# Patient Record
Sex: Female | Born: 1970 | Hispanic: No | Marital: Married | Smoking: Never smoker
Health system: Southern US, Community
[De-identification: ages and names within clinical notes are randomized; demographics above are authoritative.]

## PROBLEM LIST (undated history)

## (undated) DIAGNOSIS — D649 Anemia, unspecified: Secondary | ICD-10-CM

## (undated) DIAGNOSIS — M1990 Unspecified osteoarthritis, unspecified site: Secondary | ICD-10-CM

## (undated) DIAGNOSIS — E7800 Pure hypercholesterolemia, unspecified: Secondary | ICD-10-CM

## (undated) DIAGNOSIS — I10 Essential (primary) hypertension: Secondary | ICD-10-CM

---

## 2006-03-13 ENCOUNTER — Emergency Department: Payer: Self-pay | Admitting: Unknown Physician Specialty

## 2006-12-09 IMAGING — CT CT STONE STUDY
1 of 2 series · 15 of 32 positions shown, 19 images · non-contrast
Comparison: none

REASON FOR EXAM: Abdominal pain. Rm 10
COMMENTS:  LMP: Three weeks ago

[Series 2: stone · axial · 0.60mm/px · z∈[-897,-564]mm · 15 of 125 slices shown, 19 images]
[im 9/125  soft-tissue]
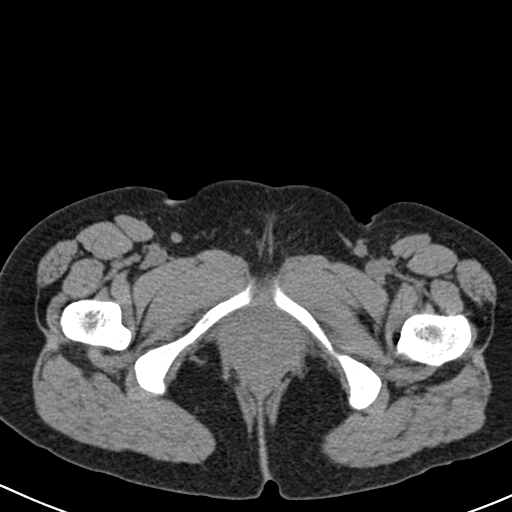
[im 9/125  bone]
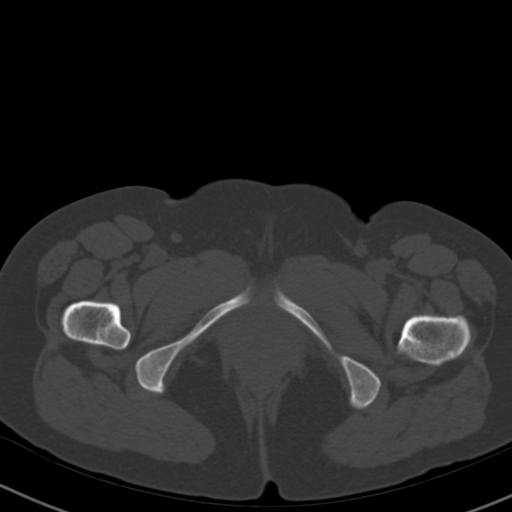
[im 18/125  soft-tissue]
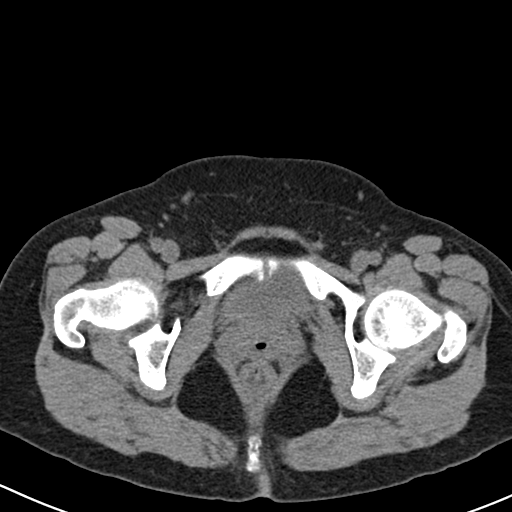
[im 27/125  soft-tissue]
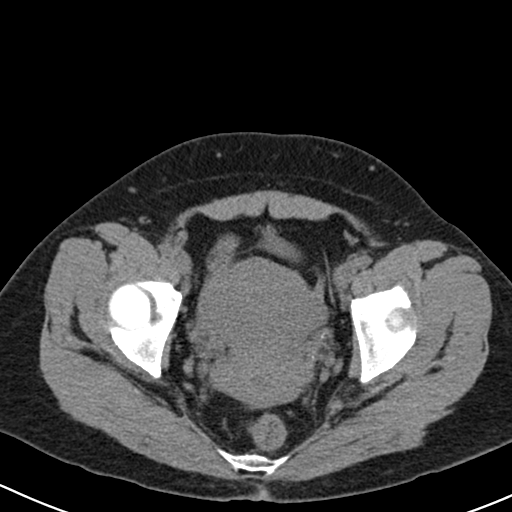
[im 36/125  soft-tissue]
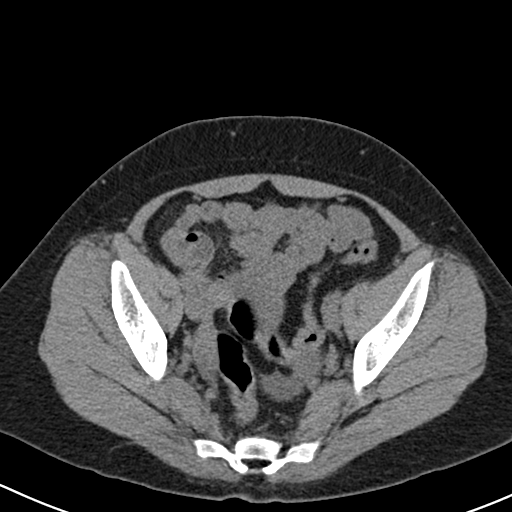
[im 45/125  soft-tissue]
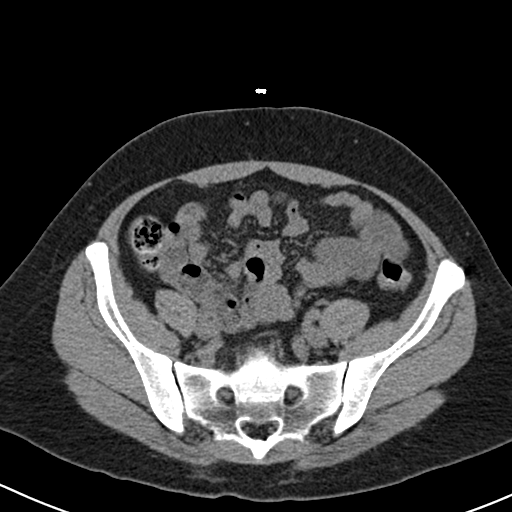
[im 54/125  soft-tissue]
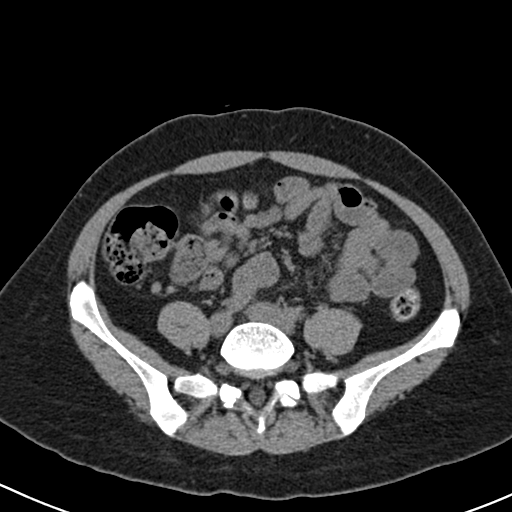
[im 63/125  soft-tissue]
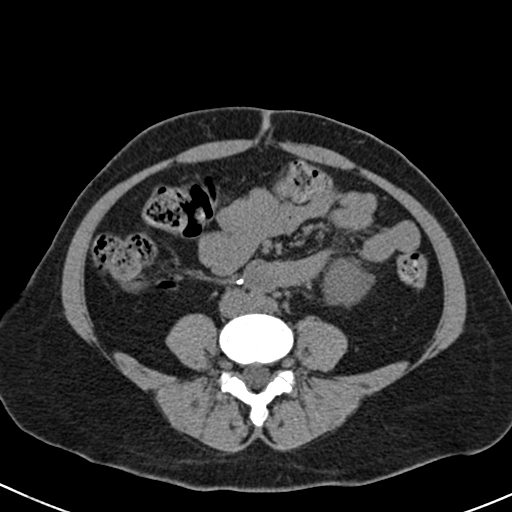
[im 71/125  soft-tissue]
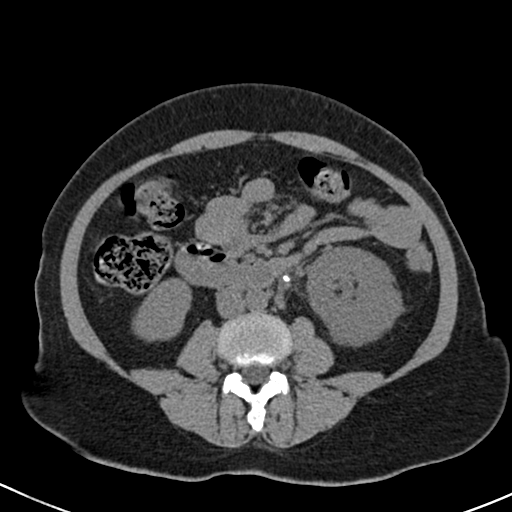
[im 80/125  soft-tissue]
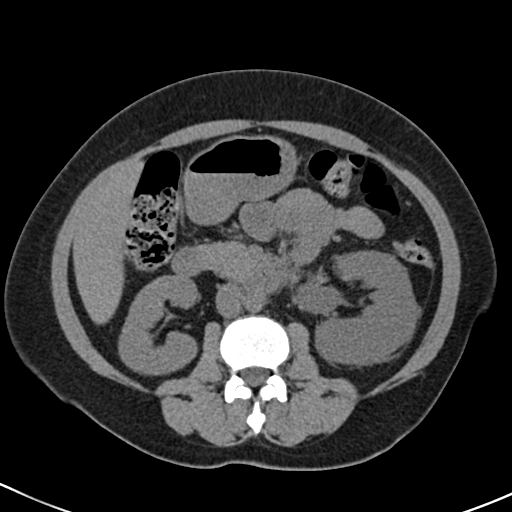
[im 80/125  bone]
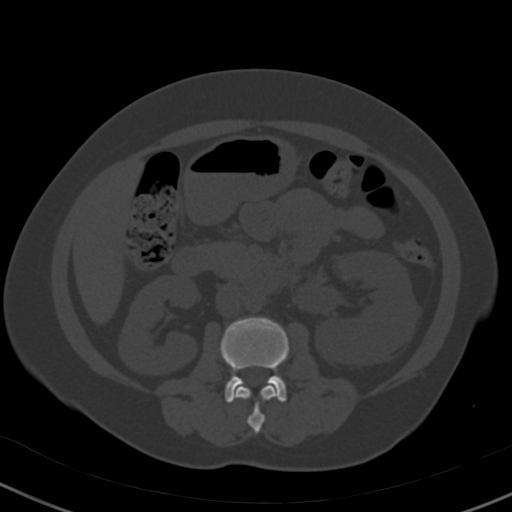
[im 89/125  soft-tissue]
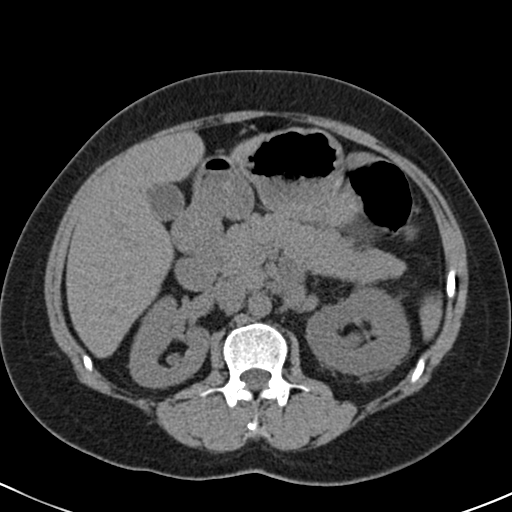
[im 98/125  soft-tissue]
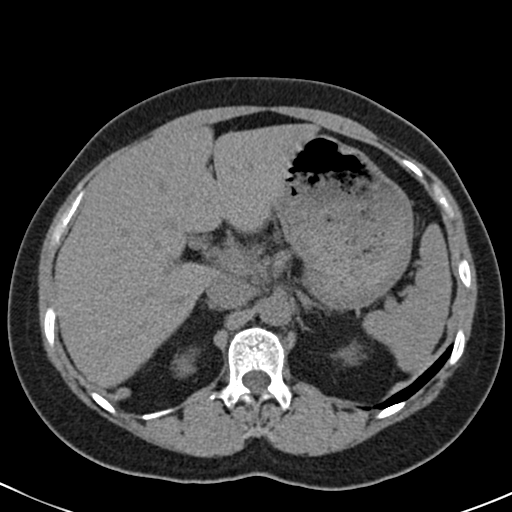
[im 107/125  soft-tissue]
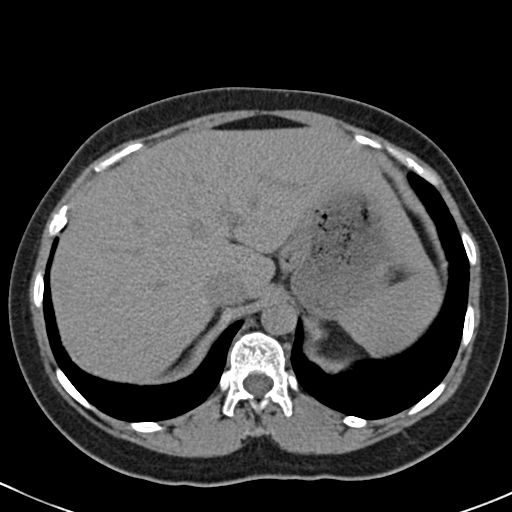
[im 107/125  lung]
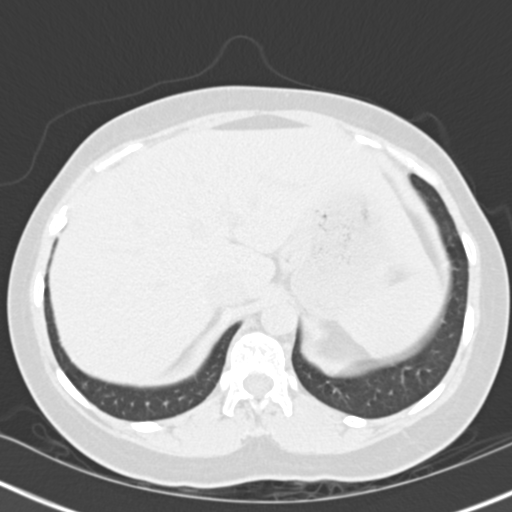
[im 111/125  lung]
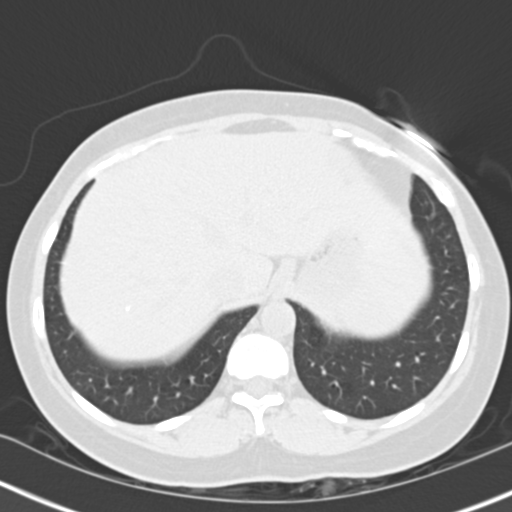
[im 116/125  soft-tissue]
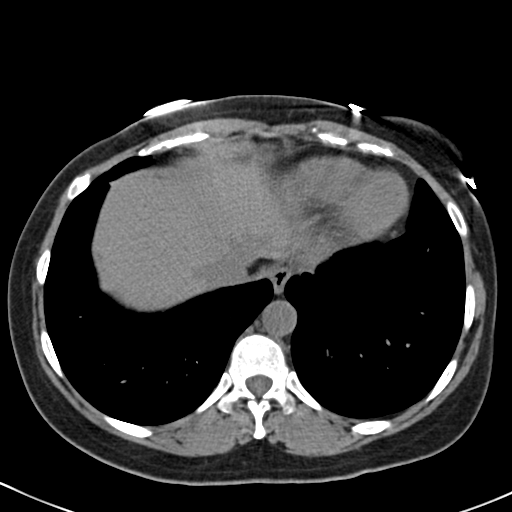
[im 116/125  lung]
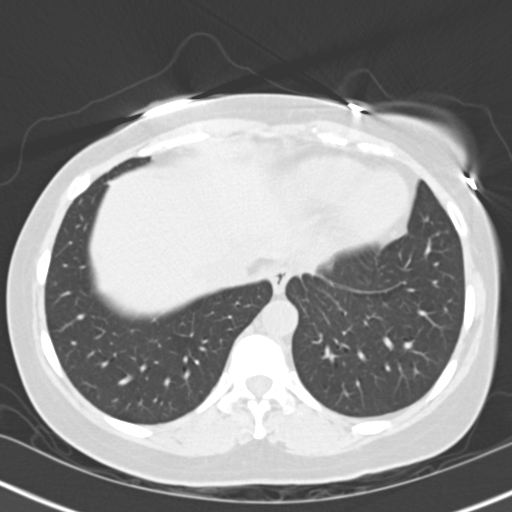
[im 120/125  lung]
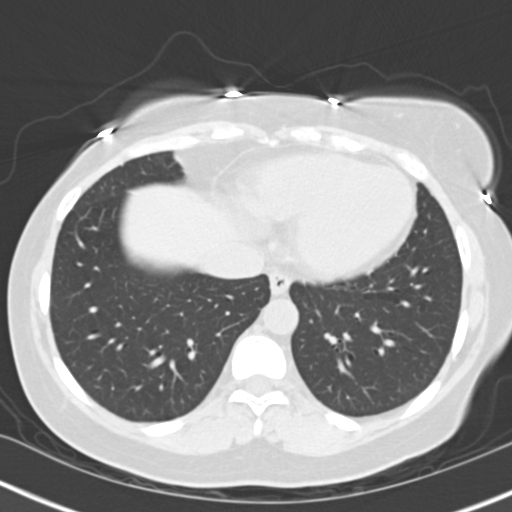

[15 of 32 positions shown; findings below may reference images not displayed]

PROCEDURE:     CT  - CT ABDOMEN /PELVIS WO (STONE)  - March 13, 2006 [DATE]

RESULT:     Emergent CT scan was performed for LEFT flank pain.  There is
noted moderate hydronephrosis of the LEFT kidney.  There is a .27 x .38 cm
calculus at the ureteropelvic junction on the LEFT.  There is incidentally
noted some small amounts of cul-de-sac fluid present.  No definite
diverticulitis is noted or appendicitis.  There is calcification anterior to
the cava which could represent a phlebolith as well as some calcifications
within the liver and may be secondary to granulomatous disease.

The images to the lung bases reveal the lung bases to be clear and no
effusions.
IMPRESSION: Moderate LEFT hydronephrosis with .27 x .38 cm calculus at the LEFT
ureteropelvic junction.

A small amount of fluid in the cul-de-sac is noted.

Report was called to the emergency room at the conclusion of the dictation.

## 2016-01-08 ENCOUNTER — Encounter
Admission: RE | Admit: 2016-01-08 | Discharge: 2016-01-08 | Disposition: A | Discharge status: Home / Self Care | Payer: BLUE CROSS/BLUE SHIELD | Source: Ambulatory Visit | Attending: Obstetrics & Gynecology | Admitting: Obstetrics & Gynecology

## 2016-01-08 DIAGNOSIS — Z01812 Encounter for preprocedural laboratory examination: Secondary | ICD-10-CM | POA: Insufficient documentation

## 2016-01-08 DIAGNOSIS — Z01810 Encounter for preprocedural cardiovascular examination: Secondary | ICD-10-CM | POA: Insufficient documentation

## 2016-01-08 HISTORY — DX: Unspecified osteoarthritis, unspecified site: M19.90

## 2016-01-08 HISTORY — DX: Pure hypercholesterolemia, unspecified: E78.00

## 2016-01-08 HISTORY — DX: Essential (primary) hypertension: I10

## 2016-01-08 HISTORY — DX: Anemia, unspecified: D64.9

## 2016-01-08 LAB — BASIC METABOLIC PANEL
ANION GAP: 6 (ref 5–15)
BUN: 12 mg/dL (ref 6–20)
CALCIUM: 8.8 mg/dL — AB (ref 8.9–10.3)
CO2: 26 mmol/L (ref 22–32)
CREATININE: 0.54 mg/dL (ref 0.44–1.00)
Chloride: 105 mmol/L (ref 101–111)
GLUCOSE: 85 mg/dL (ref 65–99)
Potassium: 3 mmol/L — ABNORMAL LOW (ref 3.5–5.1)
Sodium: 137 mmol/L (ref 135–145)

## 2016-01-08 LAB — CBC
HCT: 34.6 % — ABNORMAL LOW (ref 35.0–47.0)
Hemoglobin: 11.4 g/dL — ABNORMAL LOW (ref 12.0–16.0)
MCH: 29.6 pg (ref 26.0–34.0)
MCHC: 32.8 g/dL (ref 32.0–36.0)
MCV: 90.2 fL (ref 80.0–100.0)
PLATELETS: 374 10*3/uL (ref 150–440)
RBC: 3.84 MIL/uL (ref 3.80–5.20)
RDW: 18 % — AB (ref 11.5–14.5)
WBC: 10.1 10*3/uL (ref 3.6–11.0)

## 2016-01-08 LAB — TYPE AND SCREEN
ABO/RH(D): O POS
ANTIBODY SCREEN: NEGATIVE

## 2016-01-08 LAB — ABO/RH: ABO/RH(D): O POS

## 2016-01-08 NOTE — Patient Instructions (Signed)
  Your procedure is scheduled on: January 14, 2016 (Thursday) Report to Day Surgery.([facility] ) SECOND FLOOR To find out your arrival time please call [phone] between 1PM - 3PM on January 13, 2016 (Wednesday).  Remember: Instructions that are not followed completely may result in serious medical risk, up to and including death, or upon the discretion of your surgeon and anesthesiologist your surgery may need to be rescheduled.    __x__ 1. Do not eat food or drink liquids after midnight. No gum chewing or hard candies.     ____ 2. No Alcohol for 24 hours before or after surgery.   ____ 3. Bring all medications with you on the day of surgery if instructed.    __x__ 4. Notify your doctor if there is any change in your medical condition     (cold, fever, infections).     Do not wear jewelry, make-up, hairpins, clips or nail polish.  Do not wear lotions, powders, or perfumes. You may wear deodorant.  Do not shave 48 hours prior to surgery. Men may shave face and neck.  Do not bring valuables to the hospital.    [facility] is not responsible for any belongings or valuables.               Contacts, dentures or bridgework may not be worn into surgery.  Leave your suitcase in the car. After surgery it may be brought to your room.  For patients admitted to the hospital, discharge time is determined by your                treatment team.   Patients discharged the day of surgery will not be allowed to drive home.   Please read over the following fact sheets that you were given:   Surgical Site Infection Prevention   __x__ Take these medicines the morning of surgery with A SIP OF WATER:    1. Prednisone  2.   3.   4.  5.  6.  ____ Fleet Enema (as directed)   __x__ Use CHG Soap as directed  ____ Use inhalers on the day of surgery  ____ Stop metformin 2 days prior to surgery    ____ Take 1/2 of usual insulin dose the night before surgery and none on the morning of surgery.    __x__ Stop Coumadin/Plavix/aspirin on (N/A)  __x__ Stop Anti-inflammatories on (NO NSAIDS) Tylenol ok to take for pain if needed   ____ Stop supplements until after surgery.    ____ Bring C-Pap to the hospital.

## 2016-01-14 ENCOUNTER — Ambulatory Visit: Payer: BLUE CROSS/BLUE SHIELD | Admitting: Anesthesiology

## 2016-01-14 ENCOUNTER — Ambulatory Visit
Admission: RE | Admit: 2016-01-14 | Discharge: 2016-01-14 | Disposition: A | Discharge status: Home / Self Care | Payer: BLUE CROSS/BLUE SHIELD | Source: Ambulatory Visit | Attending: Obstetrics & Gynecology | Admitting: Obstetrics & Gynecology

## 2016-01-14 ENCOUNTER — Encounter: Payer: Self-pay | Admitting: *Deleted

## 2016-01-14 ENCOUNTER — Encounter
Admission: RE | Disposition: A | Discharge status: Home / Self Care | Payer: Self-pay | Source: Ambulatory Visit | Attending: Obstetrics & Gynecology

## 2016-01-14 DIAGNOSIS — I10 Essential (primary) hypertension: Secondary | ICD-10-CM | POA: Diagnosis not present

## 2016-01-14 DIAGNOSIS — D259 Leiomyoma of uterus, unspecified: Secondary | ICD-10-CM | POA: Insufficient documentation

## 2016-01-14 DIAGNOSIS — N939 Abnormal uterine and vaginal bleeding, unspecified: Secondary | ICD-10-CM | POA: Diagnosis not present

## 2016-01-14 DIAGNOSIS — Z79899 Other long term (current) drug therapy: Secondary | ICD-10-CM | POA: Insufficient documentation

## 2016-01-14 DIAGNOSIS — E7800 Pure hypercholesterolemia, unspecified: Secondary | ICD-10-CM | POA: Insufficient documentation

## 2016-01-14 DIAGNOSIS — Z882 Allergy status to sulfonamides status: Secondary | ICD-10-CM | POA: Insufficient documentation

## 2016-01-14 DIAGNOSIS — M069 Rheumatoid arthritis, unspecified: Secondary | ICD-10-CM | POA: Insufficient documentation

## 2016-01-14 DIAGNOSIS — N920 Excessive and frequent menstruation with regular cycle: Secondary | ICD-10-CM | POA: Diagnosis present

## 2016-01-14 DIAGNOSIS — N838 Other noninflammatory disorders of ovary, fallopian tube and broad ligament: Secondary | ICD-10-CM | POA: Insufficient documentation

## 2016-01-14 DIAGNOSIS — D649 Anemia, unspecified: Secondary | ICD-10-CM | POA: Diagnosis not present

## 2016-01-14 HISTORY — PX: LAPAROSCOPIC HYSTERECTOMY: SHX1926

## 2016-01-14 LAB — POCT I-STAT 4, (NA,K, GLUC, HGB,HCT)
GLUCOSE: 85 mg/dL (ref 65–99)
Glucose, Bld: 85 mg/dL (ref 65–99)
HCT: 29 % — ABNORMAL LOW (ref 36.0–46.0)
HCT: 30 % — ABNORMAL LOW (ref 36.0–46.0)
HEMOGLOBIN: 10.2 g/dL — AB (ref 12.0–15.0)
Hemoglobin: 9.9 g/dL — ABNORMAL LOW (ref 12.0–15.0)
POTASSIUM: 3.1 mmol/L — AB (ref 3.5–5.1)
POTASSIUM: 3.1 mmol/L — AB (ref 3.5–5.1)
SODIUM: 142 mmol/L (ref 135–145)
Sodium: 141 mmol/L (ref 135–145)

## 2016-01-14 LAB — POCT PREGNANCY, URINE: PREG TEST UR: NEGATIVE

## 2016-01-14 SURGERY — HYSTERECTOMY, TOTAL, LAPAROSCOPIC
Anesthesia: General | Laterality: Bilateral

## 2016-01-14 MED ORDER — ONDANSETRON HCL 4 MG/2ML IJ SOLN: 4.0000 mg | Freq: Once | INTRAMUSCULAR | Status: DC | PRN

## 2016-01-14 MED ORDER — ROCURONIUM BROMIDE 100 MG/10ML IV SOLN
INTRAVENOUS | Status: DC | PRN
  Administered 2016-01-14: 10 mg via INTRAVENOUS
  Administered 2016-01-14: 50 mg via INTRAVENOUS

## 2016-01-14 MED ORDER — HEPARIN SODIUM (PORCINE) 5000 UNIT/ML IJ SOLN
INTRAMUSCULAR | Status: AC
  Administered 2016-01-14: 5000 [IU] via SUBCUTANEOUS
  Filled 2016-01-14: qty 1

## 2016-01-14 MED ORDER — SUGAMMADEX SODIUM 200 MG/2ML IV SOLN
INTRAVENOUS | Status: DC | PRN
  Administered 2016-01-14: 107 mg via INTRAVENOUS

## 2016-01-14 MED ORDER — PROPOFOL 10 MG/ML IV BOLUS
INTRAVENOUS | Status: DC | PRN
  Administered 2016-01-14: 150 mg via INTRAVENOUS

## 2016-01-14 MED ORDER — CEFAZOLIN SODIUM-DEXTROSE 2-3 GM-% IV SOLR
INTRAVENOUS | Status: DC | PRN
  Administered 2016-01-14: 2 g via INTRAVENOUS

## 2016-01-14 MED ORDER — CEFAZOLIN SODIUM-DEXTROSE 2-4 GM/100ML-% IV SOLN
INTRAVENOUS | Status: AC
  Administered 2016-01-14: 2 g via INTRAVENOUS
  Filled 2016-01-14: qty 100

## 2016-01-14 MED ORDER — ONDANSETRON HCL 4 MG/2ML IJ SOLN
INTRAMUSCULAR | Status: DC | PRN
  Administered 2016-01-14: 4 mg via INTRAVENOUS

## 2016-01-14 MED ORDER — ACETAMINOPHEN 10 MG/ML IV SOLN
INTRAVENOUS | Status: AC
  Filled 2016-01-14: qty 100

## 2016-01-14 MED ORDER — METHYLPREDNISOLONE SODIUM SUCC 125 MG IJ SOLR
INTRAMUSCULAR | Status: DC | PRN
  Administered 2016-01-14: 125 mg via INTRAVENOUS

## 2016-01-14 MED ORDER — GABAPENTIN 300 MG PO CAPS
ORAL_CAPSULE | ORAL | Status: AC
  Administered 2016-01-14: 300 mg via ORAL
  Filled 2016-01-14: qty 1

## 2016-01-14 MED ORDER — ACETAMINOPHEN 10 MG/ML IV SOLN
INTRAVENOUS | Status: DC | PRN
  Administered 2016-01-14: 1000 mg via INTRAVENOUS

## 2016-01-14 MED ORDER — LIDOCAINE HCL (CARDIAC) 20 MG/ML IV SOLN
INTRAVENOUS | Status: DC | PRN
  Administered 2016-01-14: 80 mg via INTRAVENOUS

## 2016-01-14 MED ORDER — ACETAMINOPHEN 10 MG/ML IV SOLN: 1000.0000 mg | Freq: Once | INTRAVENOUS | Status: DC

## 2016-01-14 MED ORDER — MORPHINE SULFATE (PF) 2 MG/ML IV SOLN: 1.0000 mg | INTRAVENOUS | Status: DC | PRN

## 2016-01-14 MED ORDER — EPHEDRINE SULFATE 50 MG/ML IJ SOLN
INTRAMUSCULAR | Status: DC | PRN
  Administered 2016-01-14 (×2): 10 mg via INTRAVENOUS

## 2016-01-14 MED ORDER — BUPIVACAINE HCL (PF) 0.5 % IJ SOLN
INTRAMUSCULAR | Status: AC
  Filled 2016-01-14: qty 30

## 2016-01-14 MED ORDER — OXYCODONE-ACETAMINOPHEN 5-325 MG PO TABS: 1.0000 | ORAL_TABLET | ORAL | Status: AC | PRN

## 2016-01-14 MED ORDER — GABAPENTIN 300 MG PO CAPS
300.0000 mg | ORAL_CAPSULE | Freq: Once | ORAL | Status: AC
  Administered 2016-01-14: 300 mg via ORAL

## 2016-01-14 MED ORDER — KETOROLAC TROMETHAMINE 30 MG/ML IJ SOLN
30.0000 mg | Freq: Four times a day (QID) | INTRAMUSCULAR | Status: DC
  Administered 2016-01-14: 30 mg via INTRAVENOUS
  Filled 2016-01-14: qty 1

## 2016-01-14 MED ORDER — FENTANYL CITRATE (PF) 250 MCG/5ML IJ SOLN
INTRAMUSCULAR | Status: DC | PRN
  Administered 2016-01-14 (×2): 50 ug via INTRAVENOUS
  Administered 2016-01-14: 100 ug via INTRAVENOUS
  Administered 2016-01-14: 50 ug via INTRAVENOUS

## 2016-01-14 MED ORDER — BUPIVACAINE HCL 0.5 % IJ SOLN
INTRAMUSCULAR | Status: DC | PRN
  Administered 2016-01-14: 10 mL

## 2016-01-14 MED ORDER — FAMOTIDINE 20 MG PO TABS
20.0000 mg | ORAL_TABLET | Freq: Once | ORAL | Status: AC
  Administered 2016-01-14: 20 mg via ORAL

## 2016-01-14 MED ORDER — CEFAZOLIN SODIUM-DEXTROSE 2-4 GM/100ML-% IV SOLN
2.0000 g | Freq: Once | INTRAVENOUS | Status: AC
  Administered 2016-01-14: 2 g via INTRAVENOUS

## 2016-01-14 MED ORDER — IBUPROFEN 600 MG PO TABS: 600.0000 mg | ORAL_TABLET | Freq: Four times a day (QID) | ORAL | Status: AC

## 2016-01-14 MED ORDER — LACTATED RINGERS IV SOLN
INTRAVENOUS | Status: DC
  Administered 2016-01-14: 11:00:00 via INTRAVENOUS
  Administered 2016-01-14: 50 mL/h via INTRAVENOUS

## 2016-01-14 MED ORDER — MIDAZOLAM HCL 5 MG/5ML IJ SOLN
INTRAMUSCULAR | Status: DC | PRN
  Administered 2016-01-14: 2 mg via INTRAVENOUS

## 2016-01-14 MED ORDER — FAMOTIDINE 20 MG PO TABS
ORAL_TABLET | ORAL | Status: AC
  Administered 2016-01-14: 20 mg via ORAL
  Filled 2016-01-14: qty 1

## 2016-01-14 MED ORDER — GABAPENTIN 600 MG PO TABS
300.0000 mg | ORAL_TABLET | Freq: Once | ORAL | Status: DC
  Filled 2016-01-14: qty 0.5

## 2016-01-14 MED ORDER — HEPARIN SODIUM (PORCINE) 5000 UNIT/ML IJ SOLN
5000.0000 [IU] | Freq: Once | INTRAMUSCULAR | Status: AC
  Administered 2016-01-14: 5000 [IU] via SUBCUTANEOUS

## 2016-01-14 MED ORDER — KETOROLAC TROMETHAMINE 30 MG/ML IJ SOLN
INTRAMUSCULAR | Status: AC
  Administered 2016-01-14: 30 mg via INTRAVENOUS
  Filled 2016-01-14: qty 1

## 2016-01-14 MED ORDER — FENTANYL CITRATE (PF) 100 MCG/2ML IJ SOLN
25.0000 ug | INTRAMUSCULAR | Status: DC | PRN
  Administered 2016-01-14 (×4): 25 ug via INTRAVENOUS

## 2016-01-14 MED ORDER — FENTANYL CITRATE (PF) 100 MCG/2ML IJ SOLN
INTRAMUSCULAR | Status: AC
  Administered 2016-01-14: 25 ug via INTRAVENOUS
  Filled 2016-01-14: qty 2

## 2016-01-14 SURGICAL SUPPLY — 56 items
BAG URO DRAIN 2000ML W/SPOUT (MISCELLANEOUS) ×3 IMPLANT
BLADE SURG SZ10 CARB STEEL (BLADE) ×6 IMPLANT
BLADE SURG SZ11 CARB STEEL (BLADE) ×3 IMPLANT
CANISTER SUCT 1200ML W/VALVE (MISCELLANEOUS) ×3 IMPLANT
CATH FOLEY 2WAY  5CC 16FR (CATHETERS) ×2
CATH URTH 16FR FL 2W BLN LF (CATHETERS) ×1 IMPLANT
CHLORAPREP W/TINT 26ML (MISCELLANEOUS) ×3 IMPLANT
DEFOGGER SCOPE WARMER CLEARIFY (MISCELLANEOUS) ×3 IMPLANT
DEVICE SUTURE ENDOST 10MM (ENDOMECHANICALS) IMPLANT
DRAPE LEGGINS SURG 28X43 STRL (DRAPES) ×3 IMPLANT
DRAPE SHEET LG 3/4 BI-LAMINATE (DRAPES) ×3 IMPLANT
DRAPE UNDER BUTTOCK W/FLU (DRAPES) ×3 IMPLANT
DRSG TEGADERM 2-3/8X2-3/4 SM (GAUZE/BANDAGES/DRESSINGS) ×9 IMPLANT
GAUZE SPONGE NON-WVN 2X2 STRL (MISCELLANEOUS) ×1 IMPLANT
GLOVE PI ORTHOPRO 6.5 (GLOVE) ×8
GLOVE PI ORTHOPRO STRL 6.5 (GLOVE) ×4 IMPLANT
GLOVE SURG SYN 6.5 ES PF (GLOVE) ×24 IMPLANT
GOWN STRL REUS W/ TWL LRG LVL3 (GOWN DISPOSABLE) ×3 IMPLANT
GOWN STRL REUS W/ TWL XL LVL3 (GOWN DISPOSABLE) ×1 IMPLANT
GOWN STRL REUS W/TWL LRG LVL3 (GOWN DISPOSABLE) ×6
GOWN STRL REUS W/TWL XL LVL3 (GOWN DISPOSABLE) ×2
GRASPER SUT TROCAR 14GX15 (MISCELLANEOUS) ×3 IMPLANT
HIBICLENS CHG 4% 32OZ (MISCELLANEOUS) ×3 IMPLANT
IRRIGATION STRYKERFLOW (MISCELLANEOUS) ×1 IMPLANT
IRRIGATOR STRYKERFLOW (MISCELLANEOUS) ×3
IV LACTATED RINGERS 1000ML (IV SOLUTION) ×3 IMPLANT
KIT PINK PAD W/HEAD ARE REST (MISCELLANEOUS) ×3
KIT PINK PAD W/HEAD ARM REST (MISCELLANEOUS) ×1 IMPLANT
KIT RM TURNOVER CYSTO AR (KITS) ×3 IMPLANT
LABEL OR SOLS (LABEL) IMPLANT
LIGASURE BLUNT 5MM 37CM (INSTRUMENTS) ×3 IMPLANT
LIQUID BAND (GAUZE/BANDAGES/DRESSINGS) ×3 IMPLANT
MANIPULATOR VCARE LG CRV RETR (MISCELLANEOUS) ×3 IMPLANT
MANIPULATOR VCARE SML CRV RETR (MISCELLANEOUS) IMPLANT
MANIPULATOR VCARE STD CRV RETR (MISCELLANEOUS) IMPLANT
NS IRRIG 500ML POUR BTL (IV SOLUTION) ×3 IMPLANT
PACK LAP CHOLECYSTECTOMY (MISCELLANEOUS) ×3 IMPLANT
PAD OB MATERNITY 4.3X12.25 (PERSONAL CARE ITEMS) ×3 IMPLANT
PAD PREP 24X41 OB/GYN DISP (PERSONAL CARE ITEMS) ×3 IMPLANT
PENCIL ELECTRO HAND CTR (MISCELLANEOUS) ×3 IMPLANT
SCISSORS METZENBAUM CVD 33 (INSTRUMENTS) ×3 IMPLANT
SET CYSTO W/LG BORE CLAMP LF (SET/KITS/TRAYS/PACK) ×3 IMPLANT
SLEEVE ENDOPATH XCEL 5M (ENDOMECHANICALS) ×3 IMPLANT
SPONGE VERSALON 2X2 STRL (MISCELLANEOUS) ×2
SPONGE XRAY 4X4 16PLY STRL (MISCELLANEOUS) ×3 IMPLANT
SURGILUBE 2OZ TUBE FLIPTOP (MISCELLANEOUS) ×3 IMPLANT
SUT ENDO VLOC 180-0-8IN (SUTURE) IMPLANT
SUT MNCRL AB 4-0 PS2 18 (SUTURE) ×3 IMPLANT
SUT VIC AB 0 CT1 36 (SUTURE) ×3 IMPLANT
SUT VIC AB 2-0 UR6 27 (SUTURE) ×3 IMPLANT
SUT VIC AB 4-0 FS2 27 (SUTURE) ×6 IMPLANT
SYR 50ML LL SCALE MARK (SYRINGE) IMPLANT
SYRINGE 10CC LL (SYRINGE) ×3 IMPLANT
TROCAR BLUNT TIP 12MM OMST12BT (TROCAR) ×3 IMPLANT
TROCAR XCEL NON-BLD 5MMX100MML (ENDOMECHANICALS) ×3 IMPLANT
TUBING INSUFFLATOR HEATED (MISCELLANEOUS) ×3 IMPLANT

## 2016-01-14 NOTE — OR Nursing (Signed)
Dr. Ronelle Nigh notified of potassium level of 3.1 repeated twice by istat.  He was okay with this level and did not want a repeat potassium drawn by lab.

## 2016-01-14 NOTE — H&P (Signed)
H&P Update  PLEASE SEE PAPER H&P  Pt was last seen in my office, and complete history and physical performed.  The surgical history has been reviewed and remains accurate without interval change. The patient was re-examined and patient's physiologic condition has not changed significantly in the last 30 days.  No new pharmacological allergies or types of therapy has been initiated.  Allergies  Allergen Reactions  . Sulfa Antibiotics Rash    Past Medical History  Diagnosis Date  . Hypertension   . Anemia   . Hypercholesterolemia   . Arthritis     Rheumatoid   History reviewed. No pertinent past surgical history.  BP 131/94 mmHg  Pulse 97  Temp(Src) 97 F (36.1 C) (Oral)  Resp 16  Ht 4\' 11"  (1.499 m)  Wt 53.524 kg (118 lb)  BMI 23.82 kg/m2  SpO2 100%  LMP 12/31/2015 (Exact Date)  NAD RRR no murmurs CTAB, no wheezing, resps unlabored +BS, soft, NTTP No c/c/e Pelvic exam deferred  The above history was confirmed with the patient. The condition still exists that makes this procedure necessary. Surgical plan includes laparoscopic total hysterectomy, bilateral salpingectomy, possible open, as confirmed on the consent. The treatment plan remains the same, without new options for care.  The patient understands the potential benefits and risks and the consents have been signed and placed on the chart.     Larey Days, MD Attending Obstetrician Gynecologist [facility]

## 2016-01-14 NOTE — Anesthesia Postprocedure Evaluation (Signed)
Anesthesia Post Note  Patient: Meagan Scott  Procedure(s) Performed: Procedure(s) (LRB): HYSTERECTOMY TOTAL LAPAROSCOPIC/BIL.SALPINGECTOMY (Bilateral)  Patient location during evaluation: PACU Anesthesia Type: General Level of consciousness: awake and alert Pain management: pain level controlled Vital Signs Assessment: post-procedure vital signs reviewed and stable Respiratory status: spontaneous breathing and respiratory function stable Cardiovascular status: stable Anesthetic complications: no    Last Vitals:  Filed Vitals:   01/14/16 1235 01/14/16 1240  BP: 156/98 156/98  Pulse: 103 100  Temp:    Resp: 17 21    Last Pain:  Filed Vitals:   01/14/16 1244  PainSc: 6                  KEPHART,WILLIAM K

## 2016-01-14 NOTE — Discharge Instructions (Signed)
Discharge instructions:  Call office if you have any of the following: fever >101 F, chills, excessive vaginal bleeding, incision drainage or problems, leg pain or redness, or any other concerns.   Activity: Do not lift > 10 lbs for 8 weeks.  No intercourse or tampons for 8 weeks.  No driving for 1-2 weeks.   Don't be a wimp - challenge yourself!  You will feel some pains now and then and some discomfort.  Work through them if you are able.    Don't be a hero, either - you will have limitations.  These will ease up over time.    Expect to have some right rib and shoulder pains in the first few days after surgery.  This is from the gas used in your belly, and only time will make that go away.  Be patient!  AMBULATORY SURGERY  DISCHARGE INSTRUCTIONS   1) The drugs that you were given will stay in your system until tomorrow so for the next 24 hours you should not:  A) Drive an automobile B) Make any legal decisions C) Drink any alcoholic beverage   2) You may resume regular meals tomorrow.  Today it is better to start with liquids and gradually work up to solid foods.  You may eat anything you prefer, but it is better to start with liquids, then soup and crackers, and gradually work up to solid foods.   3) Please notify your doctor immediately if you have any unusual bleeding, trouble breathing, redness and pain at the surgery site, drainage, fever, or pain not relieved by medication.    4) Additional Instructions:        Please contact your physician with any problems or Same Day Surgery at [phone], Monday through Friday 6 am to 4 pm, or [place] at [facility] number at [phone].

## 2016-01-14 NOTE — Anesthesia Procedure Notes (Signed)
Procedure Name: Intubation Date/Time: 01/14/2016 8:51 AM Performed by: Delaney Meigs Pre-anesthesia Checklist: Patient identified, Emergency Drugs available, Suction available, Patient being monitored and Timeout performed Patient Re-evaluated:Patient Re-evaluated prior to inductionOxygen Delivery Method: Circle system utilized Preoxygenation: Pre-oxygenation with 100% oxygen Intubation Type: IV induction Ventilation: Mask ventilation without difficulty Laryngoscope Size: Mac and 3 Grade View: Grade I Tube type: Oral Tube size: 7.0 mm Number of attempts: 1 Airway Equipment and Method: Stylet Placement Confirmation: ETT inserted through vocal cords under direct vision,  positive ETCO2 and breath sounds checked- equal and bilateral Secured at: 21 cm Tube secured with: Tape Dental Injury: Teeth and Oropharynx as per pre-operative assessment

## 2016-01-14 NOTE — Transfer of Care (Signed)
Immediate Anesthesia Transfer of Care Note  Patient: Meagan Scott  Procedure(s) Performed: Procedure(s): HYSTERECTOMY TOTAL LAPAROSCOPIC/BIL.SALPINGECTOMY (Bilateral)  Patient Location: PACU  Anesthesia Type:General  Level of Consciousness: sedated  Airway & Oxygen Therapy: Patient Spontanous Breathing and Patient connected to face mask oxygen  Post-op Assessment: Report given to RN and Post -op Vital signs reviewed and stable  Post vital signs: Reviewed and stable  Last Vitals:  Filed Vitals:   01/14/16 0718 01/14/16 1135  BP: 131/94 151/90  Pulse: 97 93  Temp: 36.1 C 36.3 C  Resp: 16 22    Complications: No apparent anesthesia complications

## 2016-01-14 NOTE — Anesthesia Preprocedure Evaluation (Addendum)
Anesthesia Evaluation  Patient identified by MRN, date of birth, ID band Patient awake    Reviewed: Allergy & Precautions, NPO status , Patient's Chart, lab work & pertinent test results  History of Anesthesia Complications Negative for: history of anesthetic complications  Airway Mallampati: III       Dental   Pulmonary neg pulmonary ROS,           Cardiovascular hypertension, Pt. on medications      Neuro/Psych negative neurological ROS     GI/Hepatic negative GI ROS, Neg liver ROS,   Endo/Other  negative endocrine ROS  Renal/GU negative Renal ROS     Musculoskeletal  (+) Arthritis , Rheumatoid disorders,    Abdominal   Peds  Hematology  (+) anemia ,   Anesthesia Other Findings   Reproductive/Obstetrics                            Anesthesia Physical Anesthesia Plan  ASA: III  Anesthesia Plan: General   Post-op Pain Management:    Induction: Intravenous  Airway Management Planned: Oral ETT  Additional Equipment:   Intra-op Plan:   Post-operative Plan:   Informed Consent: I have reviewed the patients History and Physical, chart, labs and discussed the procedure including the risks, benefits and alternatives for the proposed anesthesia with the patient or authorized representative who has indicated his/her understanding and acceptance.     Plan Discussed with:   Anesthesia Plan Comments:         Anesthesia Quick Evaluation

## 2016-01-15 LAB — SURGICAL PATHOLOGY

## 2016-01-19 NOTE — Op Note (Signed)
Total Laparoscopic Hysterectomy Operative Note Procedure Date: 01/14/2016  Patient:  Meagan Scott  45 y.o. female  PRE-OPERATIVE DIAGNOSIS:  abnormal uterine bleeding,fibroid uterus  POST-OPERATIVE DIAGNOSIS:  abnormal uterine bleeding, fibroid uterus  PROCEDURE:  Procedure(s): HYSTERECTOMY TOTAL LAPAROSCOPIC/BIL.SALPINGECTOMY (Bilateral)  SURGEON:  Surgeon(s) and Role:    * Riyansh Gerstner C Finley Dinkel, MD - Primary    * Malachy Mood, MD - Assisting  ANESTHESIA:  General via ET  I/O   1000cc crystalloid, 300cc UOP, 50cc EBL  FINDINGS:  Globular, large, fibroid uterus, normal ovaries and fallopian tubes bilaterally.  Normal upper abdomen.  SPECIMEN: Uterus, Cervix, and bilateral fallopian tubes (left tube separated)  COMPLICATIONS: none apparent  DISPOSITION: vital signs stable to PACU  Indication for Surgery: 45 y.o. G0 with long history of severe menorrhagia, worsening in the last few years, and symptomatic 6cm fibroid within uterus, requesting definitive management of her condition.  She declined or has trialed other non-surgical options.   Risks of surgery were discussed with the patient including but not limited to: bleeding which may require transfusion or reoperation; infection which may require antibiotics; injury to bowel, bladder, ureters or other surrounding organs; need for additional procedures including laparotomy, blood clot, incisional problems and other postoperative/anesthesia complications. Written informed consent was obtained.      PROCEDURE IN DETAIL:  The patient had 5000u Heparin Sub-q and sequential compression devices applied to her lower extremities while in the preoperative area.  She was then taken to the operating room where general anesthesia was administered.  She was placed in the dorsal lithotomy position, and was prepped and draped in a sterile manner. A surgical time-out was performed.  A Foley catheter was inserted into her bladder and attached to constant  drainage and a V-Care uterine manipulator was then advanced into the uterus and a good fit around the cervix was noted. The gloves were changed, and attention was turned to the abdomen where a supraumbilical incision was made with the scalpel. A midline incision was made approximately 2 cm above the umbilicus.  The subcutaneous tissues were dissected, the fascia was divided, the peritoneum entered, and a balloon trochar was inserted.  Pneumoperitoneum was created to 3mmHg.   A survey of the patient's pelvis and abdomen revealed the findings as mentioned above. Two 75mm ports were inserted in the lower left and right quadrants under visualization.    The right fallopian tube was separated from the mesosalpinx using the Ligasure. The bilateral round ligaments were transected and anterior broad ligament divided and brought across the uterus to separate the vesicouterine peritoneum and create a bladder flap. The bladder was pushed away from the uterus. The bilateral uterine arteries were ligated and transected. The bilateral uterosacral and cardinal ligaments were ligated and transected. A colpotomy was made around the V-Care cervical cup and the uterus, cervix, and bilateral tubes were removed through the vagina via hand morcellation. The vaginal cuff was closed vaginally using 0-Vicryl in a running locking stitch. This was tested for integrity using the surgeon's finger. After a change of gloves, the pneumoperitoneum was recreated and surgical site inspected, and found to be hemostatic.The left mesosalpinx was ligated and the left tube was removed individually. Bilateral ureters were visualized vermiuclating. No intraoperative injury to surrounding organs was noted. The abdomen was desufflated and all instruments were then removed.   The fascia of the 89mm midline incision was closed with 0-vicryl using the inlet closure device.  All skin incisions were closed with 4-0 monocryl and covered with  surgical  glue. The patient tolerated the procedures well.  All instruments, needles, and sponge counts were correct x 2. The patient was taken to the recovery room in stable condition.   ---- Larey Days, MD Attending Obstetrician and Gynecologist [facility]
